# Patient Record
Sex: Female | Born: 1960 | Race: Black or African American | Hispanic: No | Marital: Single
Health system: Southern US, Community
[De-identification: ages and names within clinical notes are randomized; demographics above are authoritative.]

---

## 2004-08-12 ENCOUNTER — Ambulatory Visit: Payer: Self-pay | Admitting: Gynecology

## 2005-09-15 ENCOUNTER — Emergency Department: Payer: Self-pay | Admitting: Emergency Medicine

## 2005-09-15 ENCOUNTER — Other Ambulatory Visit: Payer: Self-pay

## 2005-09-24 ENCOUNTER — Ambulatory Visit: Payer: Self-pay | Admitting: Gynecology

## 2005-10-08 ENCOUNTER — Ambulatory Visit: Payer: Self-pay | Admitting: Gynecology

## 2006-02-18 ENCOUNTER — Ambulatory Visit: Payer: Self-pay | Admitting: Obstetrics & Gynecology

## 2006-02-18 ENCOUNTER — Encounter: Payer: Self-pay | Admitting: Obstetrics & Gynecology

## 2006-05-12 ENCOUNTER — Ambulatory Visit: Payer: Self-pay | Admitting: General Surgery

## 2007-02-23 ENCOUNTER — Encounter (INDEPENDENT_AMBULATORY_CARE_PROVIDER_SITE_OTHER): Payer: Self-pay | Admitting: Gynecology

## 2007-02-23 ENCOUNTER — Ambulatory Visit: Payer: Self-pay | Admitting: Obstetrics & Gynecology

## 2007-04-18 IMAGING — CR DG CHEST 2V
1 series · 2 of 2 positions shown · non-contrast
Comparison: none

REASON FOR EXAM: Chest pain
COMMENTS:

[Series 1: view not recorded · 0.17mm/px · 2 of 2 slices shown]
[im 1/2]
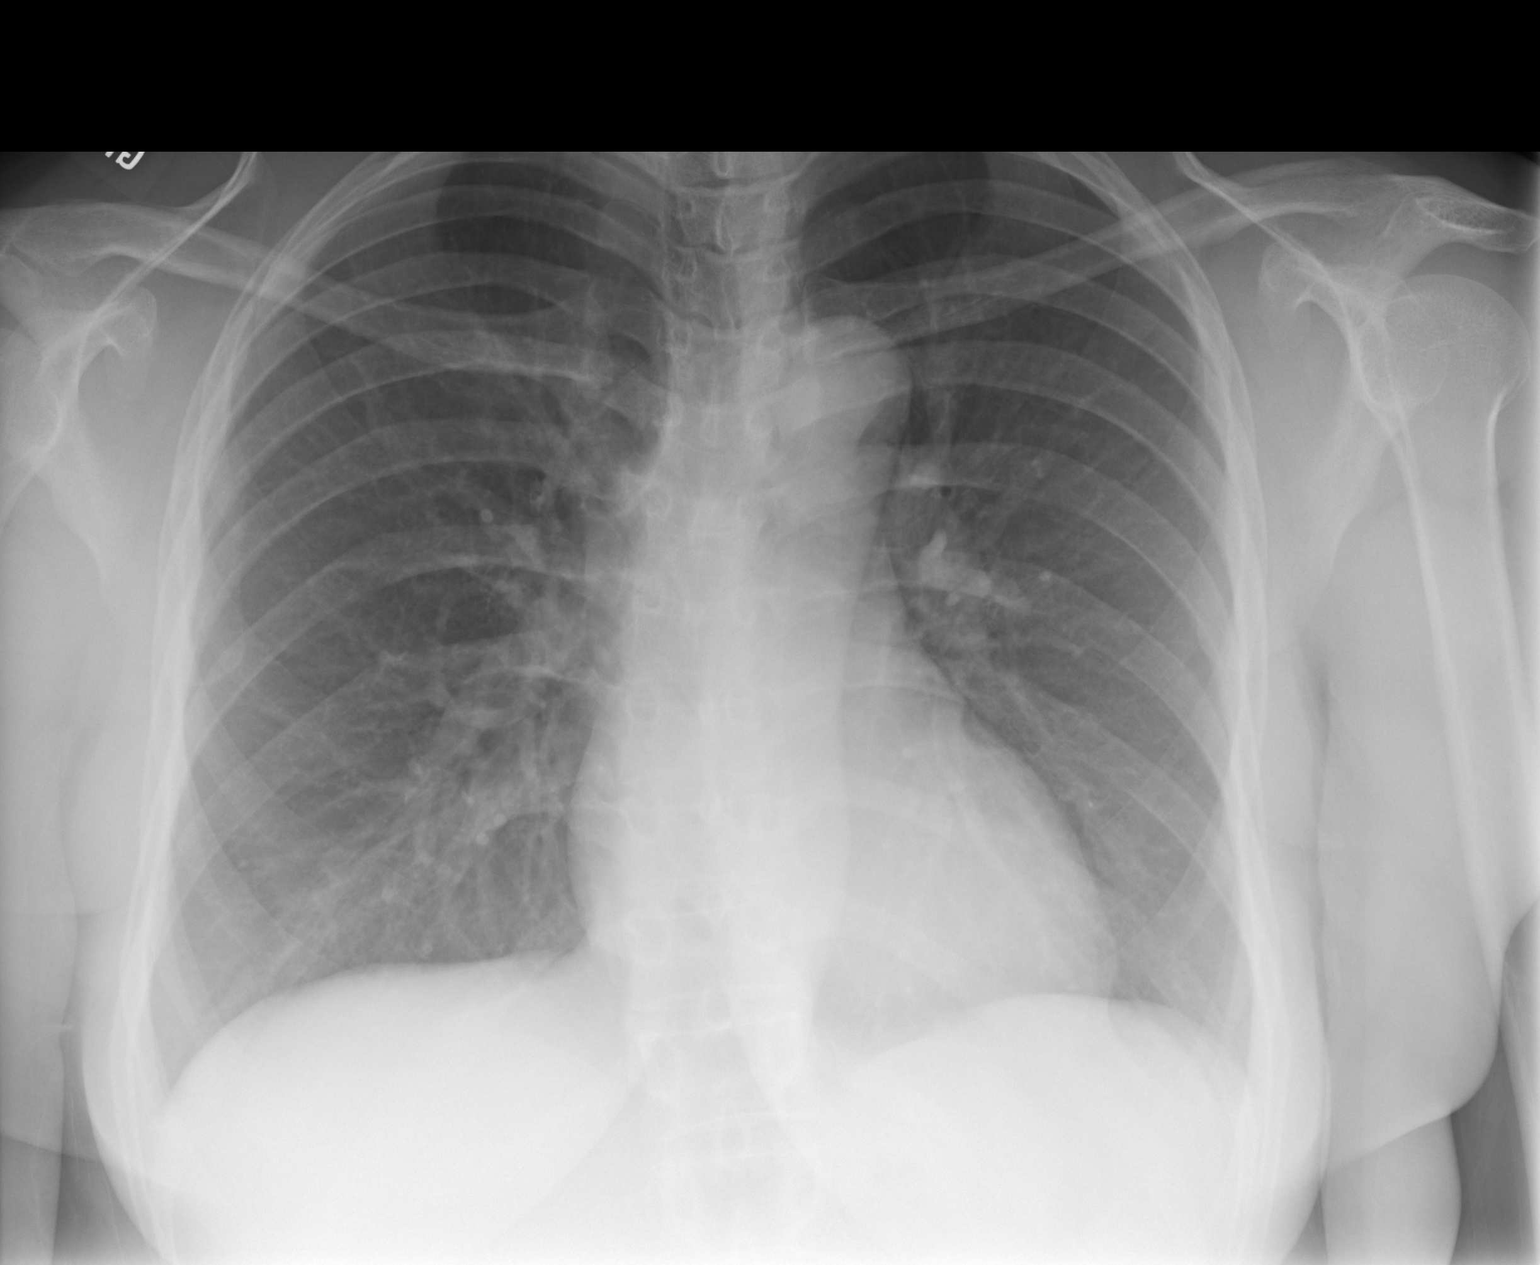
[im 2/2]
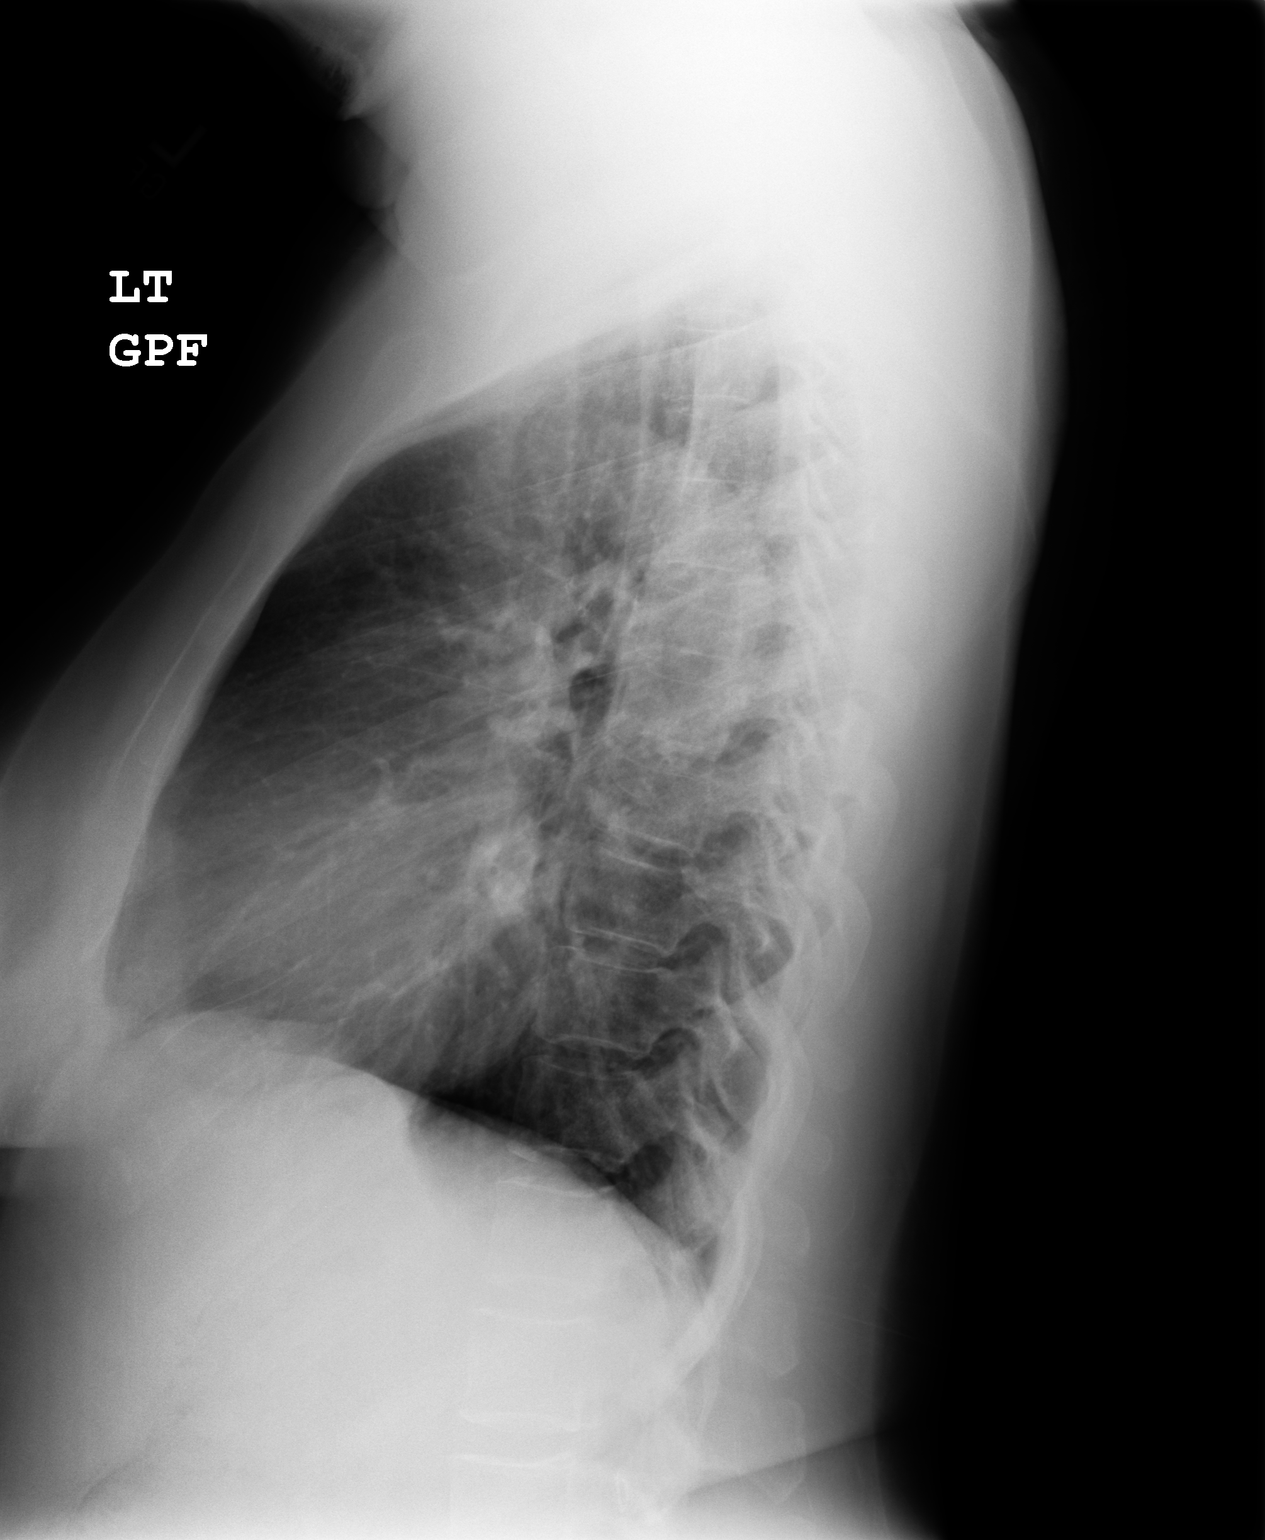

[2 of 2 positions shown; findings below may reference images not displayed]

PROCEDURE:     DXR - DXR CHEST PA (OR AP) AND LATERAL  - September 15, 2005  [DATE]

RESULT:        The lungs are mildly hyperinflated with hemidiaphragm
flattening.  There is no focal infiltrate.  The heart is not enlarged.
There is tortuosity of the descending thoracic aorta. There is no pleural
effusion.
IMPRESSION: There is mild hyperinflation which may reflect reactive airway disease.   I
do not see evidence of pneumonia.

## 2007-08-16 ENCOUNTER — Ambulatory Visit: Payer: Self-pay | Admitting: Gynecology

## 2008-02-22 ENCOUNTER — Ambulatory Visit: Payer: Self-pay | Admitting: Obstetrics and Gynecology

## 2008-02-22 ENCOUNTER — Encounter: Payer: Self-pay | Admitting: Obstetrics and Gynecology

## 2008-08-15 ENCOUNTER — Ambulatory Visit: Payer: Self-pay | Admitting: Obstetrics & Gynecology

## 2009-02-20 ENCOUNTER — Ambulatory Visit: Payer: Self-pay | Admitting: Obstetrics and Gynecology

## 2009-07-29 ENCOUNTER — Ambulatory Visit: Payer: Self-pay | Admitting: Gastroenterology

## 2009-10-01 ENCOUNTER — Ambulatory Visit: Payer: Self-pay | Admitting: Obstetrics and Gynecology

## 2010-02-26 ENCOUNTER — Ambulatory Visit: Payer: Self-pay | Admitting: Obstetrics & Gynecology

## 2010-03-20 ENCOUNTER — Encounter (INDEPENDENT_AMBULATORY_CARE_PROVIDER_SITE_OTHER): Payer: Self-pay | Admitting: *Deleted

## 2010-03-20 ENCOUNTER — Ambulatory Visit: Payer: Self-pay | Admitting: Obstetrics & Gynecology
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
## 2010-09-16 NOTE — Assessment & Plan Note (Signed)
Tamara Montes, Tamara Montes                  ACCOUNT NO.:  [id number]   MEDICAL RECORD NO.:  [id number]          PATIENT TYPE:  POB   LOCATION:  [facility]         FACILITY:  [facility]   PHYSICIAN:  Catalina Antigua, MD     DATE OF BIRTH:  21-Dec-1960   DATE OF SERVICE:  02/20/2009                                  CLINIC NOTE   This is a 50 year old para 1-0-0-1 who presents for her annual exam.  The patient is currently without any complaints.  She denies any  abnormal bleeding or discharge or pelvic pain.  She is currently taking  Microgestin Fe 1/20.   PHYSICAL EXAMINATION:  VITAL SIGNS:  Blood pressure of 140/85, pulse of  76, weight of 240 pounds and height of 5 feet 11 inches.  LUNGS:  Clear to auscultation.  HEART:  Normal sinus rhythm.  BREASTS:  Symmetrical in size.  No skin dimpling.  No palpable masses.  No nipple discharge.  No palpable axillary lymphadenopathy or  supraclavicular lymphadenopathy.  ABDOMEN:  Soft, nontender, nondistended.  PELVIC:  She had normal vaginal mucosa and normal-appearing cervix.  No  abnormal discharge or bleeding.  Bimanual exam shows a small anteverted  uterus.  No palpable adnexal masses or tenderness.   ASSESSMENT/PLAN:  This is a 50 year old para 1 who is here for her  annual exam.  Pap and cultures were performed.  The patient had a  mammogram on April 14.  The patient will return in a year or p.r.n.           ______________________________  Catalina Antigua, MD     PC/MEDQ  D:  02/20/2009  T:  02/21/2009  Job:  [id number]

## 2010-09-16 NOTE — Assessment & Plan Note (Signed)
Tamara Montes, Tamara Montes                  ACCOUNT NO.:  [id number]   MEDICAL RECORD NO.:  [id number]          PATIENT TYPE:  POB   LOCATION:  [facility]         FACILITY:  [facility]   PHYSICIAN:  Scheryl Darter, MD       DATE OF BIRTH:  1961/04/24   DATE OF SERVICE:                                  CLINIC NOTE   Patient comes for yearly physical exam.   Patient is a 50 year old black female gravida 1, para 1-0-0-1, last  menstrual period end of October.  She is on Microgestin Fe for cycle  control.  She has been on birth control pills for several years.  She  says that her periods last for about 3-4 days and she says that it can  be heavy with clots, but she only uses about 2 pads a day at most.  She  says that she is not having vasomotor symptoms.  Continue with she has a  family doctor who checks her blood pressure but she has never been  started on any antihypertensives.   PAST MEDICAL HISTORY:  1. Obesity.  2. Prehypertension.   PAST SURGICAL HISTORY:  Diagnostic laparoscopy in 1994.   FAMILY HISTORY:  Maternal aunt with breast cancer.  No history of colon  or ovarian cancer.  She has no known family history of hypertension,  diabetes, or heart disease.   SOCIAL HISTORY:  The patient denies alcohol, tobacco, or drug use.   MEDICATIONS:  Microgestin Fe 1 p.o. daily.   The patient has no drug allergies.  She does have a LATEX allergy.   REVIEW OF SYSTEMS:  The patient notes some stress urinary incontinence  with forceful cough when she has allergy symptoms.  Otherwise no  discharge, shortness of breath, fevers, chest pain, abdominal symptoms.   PHYSICAL EXAMINATION:  GENERAL:  The patient in no acute distress.  VITAL SIGNS:  Weight 243 pounds, height 5 feet 11 inches, blood pressure  is 141/94, pulse is 70 with normal affect.  CHEST:  Clear.  HEART:  Regular rhythm.  No thyromegaly.  HEENT:  The patient has partial dentures.  BREASTS:  Symmetric.  No mass, tenderness or  axillary lymphadenopathy.  ABDOMEN:  Moderate obesity, soft, nontender, no masses.  EXTREMITIES:  Shows no swelling.  PELVIC:  External genitalia, vagina and cervix appeared normal.  Pap was  done.  Uterus appears to be normal size.  No adnexal masses or  tenderness.   IMPRESSION:  1. Normal gynecologic exam.  2. Obesity and hypertension.  3. Mild symptoms of stress urinary incontinence.  4. Vasomotor symptoms despite oral contraceptive use, cycle control.   PLAN:  Recommend patient to decrease as she says her family doctor  recommended.  She may need to initiate antihypertensive therapy and she  should follow up with her family doctor.  She is scheduled for mammogram  next spring.  I have ordered a follicle stimulating hormone test today.  We will notify her the results.  If there is menopausal level, she may  be a candidate to be on the postmenopausal hormone replacement therapy  instead of oral contraceptives.  She should return for yearly  examinations.      Scheryl Darter, MD     JA/MEDQ  D:  03/20/2010  T:  03/20/2010  Job:  [id number]
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
## 2015-12-03 NOTE — Progress Notes (Signed)
This encounter was created in error - please disregard.
